# Patient Record
Sex: Male | Born: 2000 | Race: Black or African American | Hispanic: No | Marital: Single | Smoking: Never smoker
Health system: Southern US, Community
[De-identification: ages and names within clinical notes are randomized; demographics above are authoritative.]

## PROBLEM LIST (undated history)

## (undated) DIAGNOSIS — F1010 Alcohol abuse, uncomplicated: Secondary | ICD-10-CM

---
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
## 2020-02-15 ENCOUNTER — Encounter: Payer: Self-pay | Admitting: Internal Medicine

## 2020-02-15 ENCOUNTER — Other Ambulatory Visit: Payer: Self-pay

## 2020-02-15 ENCOUNTER — Inpatient Hospital Stay
Admission: EM | Admit: 2020-02-15 | Discharge: 2020-02-15 | DRG: 894 | Discharge status: Against Medical Advice | Payer: Self-pay | Attending: Internal Medicine | Admitting: Internal Medicine

## 2020-02-15 ENCOUNTER — Emergency Department: Payer: Self-pay

## 2020-02-15 DIAGNOSIS — U071 COVID-19: Secondary | ICD-10-CM | POA: Diagnosis present

## 2020-02-15 DIAGNOSIS — Y906 Blood alcohol level of 120-199 mg/100 ml: Secondary | ICD-10-CM | POA: Diagnosis present

## 2020-02-15 DIAGNOSIS — F1290 Cannabis use, unspecified, uncomplicated: Secondary | ICD-10-CM | POA: Diagnosis present

## 2020-02-15 DIAGNOSIS — G939 Disorder of brain, unspecified: Secondary | ICD-10-CM | POA: Diagnosis present

## 2020-02-15 DIAGNOSIS — G249 Dystonia, unspecified: Secondary | ICD-10-CM | POA: Diagnosis present

## 2020-02-15 DIAGNOSIS — F10920 Alcohol use, unspecified with intoxication, uncomplicated: Secondary | ICD-10-CM

## 2020-02-15 DIAGNOSIS — F10120 Alcohol abuse with intoxication, uncomplicated: Principal | ICD-10-CM | POA: Diagnosis present

## 2020-02-15 DIAGNOSIS — G253 Myoclonus: Secondary | ICD-10-CM | POA: Diagnosis present

## 2020-02-15 DIAGNOSIS — F1010 Alcohol abuse, uncomplicated: Secondary | ICD-10-CM | POA: Diagnosis present

## 2020-02-15 DIAGNOSIS — G9341 Metabolic encephalopathy: Secondary | ICD-10-CM | POA: Diagnosis present

## 2020-02-15 HISTORY — DX: Alcohol abuse, uncomplicated: F10.10

## 2020-02-15 LAB — CBC WITH DIFFERENTIAL/PLATELET
Abs Immature Granulocytes: 0.06 10*3/uL (ref 0.00–0.07)
Basophils Absolute: 0 10*3/uL (ref 0.0–0.1)
Basophils Relative: 0 %
Eosinophils Absolute: 0 10*3/uL (ref 0.0–0.5)
Eosinophils Relative: 0 %
HCT: 44.6 % (ref 39.0–52.0)
Hemoglobin: 15 g/dL (ref 13.0–17.0)
Immature Granulocytes: 1 %
Lymphocytes Relative: 8 %
Lymphs Abs: 0.7 10*3/uL (ref 0.7–4.0)
MCH: 32 pg (ref 26.0–34.0)
MCHC: 33.6 g/dL (ref 30.0–36.0)
MCV: 95.1 fL (ref 80.0–100.0)
Monocytes Absolute: 0.3 10*3/uL (ref 0.1–1.0)
Monocytes Relative: 3 %
Neutro Abs: 7.8 10*3/uL — ABNORMAL HIGH (ref 1.7–7.7)
Neutrophils Relative %: 88 %
Platelets: 293 10*3/uL (ref 150–400)
RBC: 4.69 MIL/uL (ref 4.22–5.81)
RDW: 12.8 % (ref 11.5–15.5)
WBC: 8.9 10*3/uL (ref 4.0–10.5)
nRBC: 0 % (ref 0.0–0.2)

## 2020-02-15 LAB — RESPIRATORY PANEL BY RT PCR (FLU A&B, COVID)
Influenza A by PCR: NEGATIVE
Influenza B by PCR: NEGATIVE
SARS Coronavirus 2 by RT PCR: POSITIVE — AB

## 2020-02-15 LAB — URINALYSIS, COMPLETE (UACMP) WITH MICROSCOPIC
Bacteria, UA: NONE SEEN
Bilirubin Urine: NEGATIVE
Glucose, UA: NEGATIVE mg/dL
Hgb urine dipstick: NEGATIVE
Ketones, ur: 5 mg/dL — AB
Leukocytes,Ua: NEGATIVE
Nitrite: NEGATIVE
Protein, ur: NEGATIVE mg/dL
Specific Gravity, Urine: 1.023 (ref 1.005–1.030)
pH: 8 (ref 5.0–8.0)

## 2020-02-15 LAB — COMPREHENSIVE METABOLIC PANEL
ALT: 17 U/L (ref 0–44)
AST: 25 U/L (ref 15–41)
Albumin: 4.7 g/dL (ref 3.5–5.0)
Alkaline Phosphatase: 63 U/L (ref 38–126)
Anion gap: 11 (ref 5–15)
BUN: 19 mg/dL (ref 6–20)
CO2: 26 mmol/L (ref 22–32)
Calcium: 9.4 mg/dL (ref 8.9–10.3)
Chloride: 108 mmol/L (ref 98–111)
Creatinine, Ser: 0.78 mg/dL (ref 0.61–1.24)
GFR calc Af Amer: >60 mL/min (ref >60)
GFR calc non Af Amer: >60 mL/min (ref >60)
Glucose, Bld: 122 mg/dL — ABNORMAL HIGH (ref 70–99)
Potassium: 3.8 mmol/L (ref 3.5–5.1)
Sodium: 145 mmol/L (ref 135–145)
Total Bilirubin: 0.4 mg/dL (ref 0.3–1.2)
Total Protein: 7.8 g/dL (ref 6.5–8.1)

## 2020-02-15 LAB — URINE DRUG SCREEN, QUALITATIVE (ARMC ONLY)
Amphetamines, Ur Screen: NOT DETECTED
Barbiturates, Ur Screen: NOT DETECTED
Benzodiazepine, Ur Scrn: NOT DETECTED
Cannabinoid 50 Ng, Ur ~~LOC~~: POSITIVE — AB
Cocaine Metabolite,Ur ~~LOC~~: NOT DETECTED
MDMA (Ecstasy)Ur Screen: NOT DETECTED
Methadone Scn, Ur: NOT DETECTED
Opiate, Ur Screen: NOT DETECTED
Phencyclidine (PCP) Ur S: NOT DETECTED
Tricyclic, Ur Screen: NOT DETECTED

## 2020-02-15 LAB — ACETAMINOPHEN LEVEL: Acetaminophen (Tylenol), Serum: <10 ug/mL — ABNORMAL LOW (ref 10–30)

## 2020-02-15 LAB — ETHANOL: Alcohol, Ethyl (B): 159 mg/dL — ABNORMAL HIGH (ref <10)

## 2020-02-15 LAB — CK: Total CK: 229 U/L (ref 49–397)

## 2020-02-15 LAB — LIPASE, BLOOD: Lipase: 22 U/L (ref 11–51)

## 2020-02-15 LAB — SALICYLATE LEVEL: Salicylate Lvl: <7 mg/dL — ABNORMAL LOW (ref 7.0–30.0)

## 2020-02-15 LAB — MAGNESIUM: Magnesium: 1.9 mg/dL (ref 1.7–2.4)

## 2020-02-15 MED ORDER — LORAZEPAM 2 MG/ML IJ SOLN: 0.0000 mg | Freq: Four times a day (QID) | INTRAMUSCULAR | Status: DC

## 2020-02-15 MED ORDER — ENOXAPARIN SODIUM 40 MG/0.4ML ~~LOC~~ SOLN: 40.0000 mg | SUBCUTANEOUS | Status: DC

## 2020-02-15 MED ORDER — IBUPROFEN 600 MG PO TABS: 600.0000 mg | ORAL_TABLET | Freq: Three times a day (TID) | ORAL | 0 refills | Status: DC | PRN

## 2020-02-15 MED ORDER — LORAZEPAM 2 MG/ML IJ SOLN: 1.0000 mg | Freq: Two times a day (BID) | INTRAMUSCULAR | Status: DC | PRN

## 2020-02-15 MED ORDER — SODIUM CHLORIDE 0.9 % IV BOLUS
1000.0000 mL | Freq: Once | INTRAVENOUS | Status: AC
  Administered 2020-02-15: 1000 mL via INTRAVENOUS

## 2020-02-15 MED ORDER — DROPERIDOL 2.5 MG/ML IJ SOLN
2.5000 mg | Freq: Once | INTRAMUSCULAR | Status: AC
  Administered 2020-02-15: 2.5 mg via INTRAVENOUS

## 2020-02-15 MED ORDER — ADULT MULTIVITAMIN W/MINERALS CH: 1.0000 | ORAL_TABLET | Freq: Every day | ORAL | Status: DC

## 2020-02-15 MED ORDER — DIPHENHYDRAMINE HCL 50 MG/ML IJ SOLN
INTRAMUSCULAR | Status: AC
  Administered 2020-02-15: 05:00:00 25 mg via INTRAVENOUS
  Filled 2020-02-15: qty 1

## 2020-02-15 MED ORDER — DM-GUAIFENESIN ER 30-600 MG PO TB12: 1.0000 | ORAL_TABLET | Freq: Two times a day (BID) | ORAL | Status: DC

## 2020-02-15 MED ORDER — OXYCODONE-ACETAMINOPHEN 5-325 MG PO TABS: 1.0000 | ORAL_TABLET | ORAL | 0 refills | Status: DC | PRN

## 2020-02-15 MED ORDER — ASCORBIC ACID 500 MG PO TABS: 500.0000 mg | ORAL_TABLET | Freq: Every day | ORAL | Status: DC

## 2020-02-15 MED ORDER — ONDANSETRON HCL 4 MG/2ML IJ SOLN: 4.0000 mg | Freq: Four times a day (QID) | INTRAMUSCULAR | Status: DC | PRN

## 2020-02-15 MED ORDER — LORAZEPAM 2 MG/ML IJ SOLN
1.0000 mg | Freq: Once | INTRAMUSCULAR | Status: AC
  Administered 2020-02-15: 1 mg via INTRAVENOUS
  Filled 2020-02-15: qty 1

## 2020-02-15 MED ORDER — THIAMINE HCL 100 MG/ML IJ SOLN: 100.0000 mg | Freq: Every day | INTRAMUSCULAR | Status: DC

## 2020-02-15 MED ORDER — LORAZEPAM 2 MG/ML IJ SOLN: 1.0000 mg | INTRAMUSCULAR | Status: DC | PRN

## 2020-02-15 MED ORDER — FOLIC ACID 1 MG PO TABS: 1.0000 mg | ORAL_TABLET | Freq: Every day | ORAL | Status: DC

## 2020-02-15 MED ORDER — ZINC SULFATE 220 (50 ZN) MG PO CAPS: 220.0000 mg | ORAL_CAPSULE | Freq: Every day | ORAL | Status: DC

## 2020-02-15 MED ORDER — THIAMINE HCL 100 MG PO TABS: 100.0000 mg | ORAL_TABLET | Freq: Every day | ORAL | Status: DC

## 2020-02-15 MED ORDER — DIPHENHYDRAMINE HCL 50 MG/ML IJ SOLN: 25.0000 mg | Freq: Once | INTRAMUSCULAR | Status: AC

## 2020-02-15 MED ORDER — LORAZEPAM 1 MG PO TABS: 1.0000 mg | ORAL_TABLET | ORAL | Status: DC | PRN

## 2020-02-15 MED ORDER — ALBUTEROL SULFATE HFA 108 (90 BASE) MCG/ACT IN AERS
2.0000 | INHALATION_SPRAY | RESPIRATORY_TRACT | Status: DC | PRN
  Filled 2020-02-15: qty 6.7

## 2020-02-15 MED ORDER — LORAZEPAM 2 MG/ML IJ SOLN: 0.0000 mg | Freq: Two times a day (BID) | INTRAMUSCULAR | Status: DC

## 2020-02-15 MED ORDER — ONDANSETRON HCL 4 MG PO TABS: 4.0000 mg | ORAL_TABLET | Freq: Four times a day (QID) | ORAL | Status: DC | PRN

## 2020-02-15 NOTE — Consult Note (Signed)
Referring Physician:  No referring provider defined for this encounter.  Primary Physician:  Patient, No Pcp Per  Chief Complaint:  Abnormal CT scan   History of Present Illness: 02/15/2020 NYAIR DEPAULO is a 19 y.o. male who presents with the chief complaint of cramping, agitation, and muscle jerking.  He is normally completely independent and intact.  He reports that he became agitated and had muscle twitching that impacted all 4 limbs.  It is unpredictable and has not ever happened before.  He feels his thinking and strength are normal.  Otherwise, he has no complaints.  He has significant substance use history, and did take medications off the street yesterday.   Review of Systems:  A 10 point review of systems is negative, except for the pertinent positives and negatives detailed in the HPI.  Past Medical History: Past Medical History:  Diagnosis Date  . Alcohol abuse     Past Surgical History: No past surgical history on file.  Allergies: Allergies as of 02/15/2020  . (No Known Allergies)    Medications:  Current Facility-Administered Medications:  .  albuterol (VENTOLIN HFA) 108 (90 Base) MCG/ACT inhaler 2 puff, 2 puff, Inhalation, Q4H PRN, Lorretta Harp, MD .  ascorbic acid (VITAMIN C) tablet 500 mg, 500 mg, Oral, Daily, Lorretta Harp, MD .  dextromethorphan-guaiFENesin (MUCINEX DM) 30-600 MG per 12 hr tablet 1 tablet, 1 tablet, Oral, BID, Lorretta Harp, MD .  enoxaparin (LOVENOX) injection 40 mg, 40 mg, Subcutaneous, Q24H, Lorretta Harp, MD .  folic acid (FOLVITE) tablet 1 mg, 1 mg, Oral, Daily, Lorretta Harp, MD .  LORazepam (ATIVAN) injection 0-4 mg, 0-4 mg, Intravenous, Q6H **FOLLOWED BY** [START ON 02/17/2020] LORazepam (ATIVAN) injection 0-4 mg, 0-4 mg, Intravenous, Q12H, Lorretta Harp, MD .  LORazepam (ATIVAN) injection 1 mg, 1 mg, Intravenous, Q12H PRN, Lorretta Harp, MD .  LORazepam (ATIVAN) tablet 1-4 mg, 1-4 mg, Oral, Q1H PRN **OR** LORazepam (ATIVAN) injection 1-4 mg,  1-4 mg, Intravenous, Q1H PRN, Lorretta Harp, MD .  multivitamin with minerals tablet 1 tablet, 1 tablet, Oral, Daily, Lorretta Harp, MD .  ondansetron (ZOFRAN) tablet 4 mg, 4 mg, Oral, Q6H PRN **OR** ondansetron (ZOFRAN) injection 4 mg, 4 mg, Intravenous, Q6H PRN, Lorretta Harp, MD .  thiamine tablet 100 mg, 100 mg, Oral, Daily **OR** thiamine (B-1) injection 100 mg, 100 mg, Intravenous, Daily, Lorretta Harp, MD .  zinc sulfate capsule 220 mg, 220 mg, Oral, Daily, Lorretta Harp, MD No current outpatient medications on file.   Social History: Social History   Tobacco Use  . Smoking status: Never Smoker  . Smokeless tobacco: Never Used  Substance Use Topics  . Alcohol use: Yes  . Drug use: Yes    Types: Marijuana    Family Medical History: No family history on file.  Physical Examination: Vitals:   02/15/20 0430 02/15/20 0500  BP: (!) 142/95 112/66  Pulse: 79 79  Resp: 19   Temp:    SpO2: 100% 99%     General: Patient is well developed, well nourished, calm, collected, and in no apparent distress.  Psychiatric: Patient is non-anxious.  Head:  Pupils equal, round, and reactive to light.  ENT:  Oral mucosa appears well hydrated.  Neck:   Supple.  Full range of motion.  Respiratory: Patient is breathing without any difficulty.  Extremities: No edema.  Vascular: Palpable pulses in dorsal pedal vessels.  Skin:   On exposed skin, there are no abnormal skin lesions.  NEUROLOGICAL:  General: In  no acute distress.   Sleepy but easily arousable. Oriented to person, place, and time.  Pupils equal round and reactive to light.  Facial tone is symmetric.  Tongue protrusion is midline.  There is no pronator drift.   Strength: Side Biceps Triceps Deltoid Interossei Grip Wrist Ext. Wrist Flex.  R 5 5 5 5 5 5 5   L 5 5 5 5 5 5 5    Side Iliopsoas Quads Hamstring PF DF EHL  R 5 5 5 5 5 5   L 5 5 5 5 5 5    Reflexes are 2+ and symmetric at the biceps, triceps, brachioradialis, patella and  achilles.   Bilateral upper and lower extremity sensation is intact to light touch and pin prick.  Gait is untested.   Hoffman's is absent.  Imaging: CT Head 02/15/2020 IMPRESSION: Tiny 4 mm hyperdense focus in the posterior left parietal lobe which could represent a small cavernoma, or tiny intraparenchymal contusion. If further evaluation is required would recommend MRI when patient is stable.   Electronically Signed   By: Prudencio Pair M.D.   On: 02/15/2020 06:24   I have personally reviewed the images and agree with the above interpretation.  Labs: CBC Latest Ref Rng & Units 02/15/2020  WBC 4.0 - 10.5 K/uL 8.9  Hemoglobin 13.0 - 17.0 g/dL 15.0  Hematocrit 39.0 - 52.0 % 44.6  Platelets 150 - 400 K/uL 293     Assessment and Plan: Mr. Collister is a pleasant 19 y.o. male with abnormal CT scan with hyperdense lesion most consistent with cavernoma, but possibly could be old hemorrhage or other lesion such as primary lesion of the brain or metastasis (very unlikely).  Would get neurology consultation for muscle jerking. I defer to neurology regarding AED treatment.  Agree with MRI of brain.  Please let me know when this is performed.  Likely will need outpatient follow up.      Broc Caspers K. Izora Ribas MD, [place] [facility] of Neurosurgery

## 2020-02-15 NOTE — ED Notes (Signed)
Pt given remote for TV- MRI screened pt via phone

## 2020-02-15 NOTE — ED Notes (Signed)
Pt calling out again and unable to sit still. Muscles visibly twitching and pt continues to appear unable to control his movements. Pt yelling out. MD made aware.

## 2020-02-15 NOTE — H&P (Signed)
History and Physical    [name] [name] DOB: 03/24/2001 DOA: 02/15/2020  Referring MD/NP/PA:   PCP: Patient, No Pcp Per   Patient coming from:  The patient is coming from home.  At baseline, pt is independent for most of ADL.        Chief Complaint: cramping sensation, agitation, jerking  HPI: [name] is a 19 y.o. male with medical history significant of alcohol abuse, who presents with cramping sensation, agitation, jerking.   Pt states that he has cramping sensation all over his body. Pt has agitation and yelling. He appears restless and cannot stay still.  pt was noted to have muscles twitching in ED. He repeatedly states that he feels uncomfortable. Per EDP's note, he admits that he takes "Perc 30s" off the street.  States he has not had any in a few days. He has nausea, no vomiting, diarrhea or abdominal pain.  Patient denies any cough, chest pain, shortness breath.  No fever or chills.  No symptoms of UTI.  No slurred speech or vision loss.  ED Course: pt was found to have WBC 8.9, lipase 22, urinalysis positive for cannabinoids, Tylenol level less than 10, salicylate level less than 7, alcohol level 159, postive Covid PCR, CK 229, liver function normal, electrolytes renal function okay, temperature normal, blood pressure 112/95, heart rate 79, RR 20, oxygen saturation 100% on room air, chest x-ray negative. Pt is admitted to med-surg bed as inpt. Neurosurgeon, Dr. Myer Haff is consulted.  # CT-head showed: Tiny 4 mm hyperdense focus in the posterior left parietal lobe which could represent a small cavernoma, or tiny intraparenchymal contusion. If further evaluation is required would recommend MRI when patient is stable.  Review of Systems:   General: no fevers, chills, no body weight gain, no fatigue HEENT: no blurry vision, hearing changes or sore throat Respiratory: no dyspnea, coughing, wheezing CV: no chest pain, no palpitations GI: has nausea, no  vomiting, abdominal pain, diarrhea, constipation GU: no dysuria, burning on urination, increased urinary frequency, hematuria  Ext: no leg edema Neuro: no unilateral weakness, numbness, or tingling, no vision change or hearing loss. Has agitation, muscle twitching, restless. Skin: no rash, no skin tear. MSK: No deformity, no limitation of range of movement in spin Heme: No easy bruising.  Travel history: No recent long distant travel.  Allergy: No Known Allergies  Past Medical History:  Diagnosis Date  . Alcohol abuse     No past surgical history on file.  Social History:  reports that he has never smoked. He has never used smokeless tobacco. He reports current alcohol use. He reports current drug use. Drug: Marijuana.  Family History: Reviewed with patient, patient states that all family members are healthy  Prior to Admission medications   Not on File    Physical Exam: Vitals:   02/15/20 0333 02/15/20 0420 02/15/20 0430 02/15/20 0500  BP: 125/68 122/81 (!) 142/95 112/66  Pulse: 89  79 79  Resp: 20 19 19    Temp: 98.4 F (36.9 C)     TempSrc: Oral     SpO2: 100%  100% 99%  Weight: 77.1 kg     Height: 6\' 4"  (1.93 m)      General: Not in acute distress HEENT:       Eyes: PERRL, EOMI, no scleral icterus.       ENT: No discharge from the ears and nose, no pharynx injection, no tonsillar enlargement.        Neck:  No JVD, no bruit, no mass felt. Heme: No neck lymph node enlargement. Cardiac: S1/S2, RRR, No murmurs, No gallops or rubs. Respiratory:  No rales, wheezing, rhonchi or rubs. GI: Soft, nondistended, nontender, no rebound pain, no organomegaly, BS present. GU: No hematuria Ext: No pitting leg edema bilaterally. 2+DP/PT pulse bilaterally. Musculoskeletal: No joint deformities, No joint redness or warmth, no limitation of ROM in spin. Skin: No rashes.  Neuro: Alert, anxious looking, oriented X3, cranial nerves II-XII grossly intact, moves all extremities normally.    Psych: no suicidal or hemocidal ideation.  Labs on Admission: I have personally reviewed following labs and imaging studies  CBC: Recent Labs  Lab 02/15/20 0353  WBC 8.9  NEUTROABS 7.8*  HGB 15.0  HCT 44.6  MCV 95.1  PLT 293   Basic Metabolic Panel: Recent Labs  Lab 02/15/20 0353  NA 145  K 3.8  CL 108  CO2 26  GLUCOSE 122*  BUN 19  CREATININE 0.78  CALCIUM 9.4  MG 1.9   GFR: Estimated Creatinine Clearance: 160.6 mL/min (by C-G formula based on SCr of 0.78 mg/dL). Liver Function Tests: Recent Labs  Lab 02/15/20 0353  AST 25  ALT 17  ALKPHOS 63  BILITOT 0.4  PROT 7.8  ALBUMIN 4.7   Recent Labs  Lab 02/15/20 0353  LIPASE 22   No results for input(s): AMMONIA in the last 168 hours. Coagulation Profile: No results for input(s): INR, PROTIME in the last 168 hours. Cardiac Enzymes: Recent Labs  Lab 02/15/20 0353  CKTOTAL 229   BNP (last 3 results) No results for input(s): PROBNP in the last 8760 hours. HbA1C: No results for input(s): HGBA1C in the last 72 hours. CBG: No results for input(s): GLUCAP in the last 168 hours. Lipid Profile: No results for input(s): CHOL, HDL, LDLCALC, TRIG, CHOLHDL, LDLDIRECT in the last 72 hours. Thyroid Function Tests: No results for input(s): TSH, T4TOTAL, FREET4, T3FREE, THYROIDAB in the last 72 hours. Anemia Panel: No results for input(s): VITAMINB12, FOLATE, FERRITIN, TIBC, IRON, RETICCTPCT in the last 72 hours. Urine analysis:    Component Value Date/Time   COLORURINE YELLOW (A) 02/15/2020 0353   APPEARANCEUR CLEAR (A) 02/15/2020 0353   LABSPEC 1.023 02/15/2020 0353   PHURINE 8.0 02/15/2020 0353   GLUCOSEU NEGATIVE 02/15/2020 0353   HGBUR NEGATIVE 02/15/2020 0353   BILIRUBINUR NEGATIVE 02/15/2020 0353   KETONESUR 5 (A) 02/15/2020 0353   PROTEINUR NEGATIVE 02/15/2020 0353   UROBILINOGEN 0.2 05/01/2007 2207   NITRITE NEGATIVE 02/15/2020 0353   LEUKOCYTESUR NEGATIVE 02/15/2020 0353   Sepsis  Labs: @LABRCNTIP (procalcitonin:4,lacticidven:4) ) Recent Results (from the past 240 hour(s))  Respiratory Panel by RT PCR (Flu A&B, Covid) - Nasopharyngeal Swab     Status: Abnormal   Collection Time: 02/15/20  6:21 AM   Specimen: Nasopharyngeal Swab  Result Value Ref Range Status   SARS Coronavirus 2 by RT PCR POSITIVE (A) NEGATIVE Final    Comment: RESULT CALLED TO, READ BACK BY AND VERIFIED WITH: ARIEL SMITH AT 0748 ON 02/15/2020 [facility]. (NOTE) SARS-CoV-2 target nucleic acids are DETECTED. SARS-CoV-2 RNA is generally detectable in upper respiratory specimens  during the acute phase of infection. Positive results are indicative of the presence of the identified virus, but do not rule out bacterial infection or co-infection with other pathogens not detected by the test. Clinical correlation with patient history and other diagnostic information is necessary to determine patient infection status. The expected result is Negative. Fact Sheet for Patients:  02/17/2020 Fact Sheet for  Healthcare Providers: https://www.young.biz/ This test is not yet approved or cleared by the [place] and  has been authorized for detection and/or diagnosis of SARS-CoV-2 by FDA under an Emergency Use Authorization (EUA).  This EUA will remain in effect (meaning this test can be used)  for the duration of  the COVID-19 declaration under Section 564(b)(1) of the Act, 21 U.S.C. section 360bbb-3(b)(1), unless the authorization is terminated or revoked sooner.    Influenza A by PCR NEGATIVE NEGATIVE Final   Influenza B by PCR NEGATIVE NEGATIVE Final    Comment: (NOTE) The Xpert Xpress SARS-CoV-2/FLU/RSV assay is intended as an aid in  the diagnosis of influenza from Nasopharyngeal swab specimens and  should not be used as a sole basis for treatment. Nasal washings and  aspirates are unacceptable for Xpert Xpress SARS-CoV-2/FLU/RSV  testing. Fact Sheet  for Patients: https://www.moore.com/ Fact Sheet for Healthcare Providers: https://www.young.biz/ This test is not yet approved or cleared by the [place] FDA and  has been authorized for detection and/or diagnosis of SARS-CoV-2 by  FDA under an Emergency Use Authorization (EUA). This EUA will remain  in effect (meaning this test can be used) for the duration of the  Covid-19 declaration under Section 564(b)(1) of the Act, 21  U.S.C. section 360bbb-3(b)(1), unless the authorization is  terminated or revoked. Performed at [facility], [address] [place], [place] [address]      Radiological Exams on Admission: CT Head Wo Contrast  Result Date: 02/15/2020 CLINICAL DATA:  Change in mental status EXAM: CT HEAD WITHOUT CONTRAST TECHNIQUE: Contiguous axial images were obtained from the base of the skull through the vertex without intravenous contrast. COMPARISON:  None. FINDINGS: Somewhat limited due to patient motion. There appears to be a tiny 4 mm hyperdense focus seen within the posterior left parietal lobe, series 4, image 21. No mass effect is seen. Normal gray-white differentiation. Ventricles are normal in size and contour. Vascular: No hyperdense vessel or unexpected calcification. Skull: The skull is intact. No fracture or focal lesion identified. Sinuses/Orbits: The visualized paranasal sinuses and mastoid air cells are clear. The orbits and globes intact. Other: None IMPRESSION: Tiny 4 mm hyperdense focus in the posterior left parietal lobe which could represent a small cavernoma, or tiny intraparenchymal contusion. If further evaluation is required would recommend MRI when patient is stable. Electronically Signed   By: Jonna Clark M.D.   On: 02/15/2020 06:24   DG Chest Port 1 View  Result Date: 02/15/2020 CLINICAL DATA:  Spasms and bizarre behavior EXAM: PORTABLE CHEST 1 VIEW COMPARISON:  01/30/2008 FINDINGS: The heart size and  mediastinal contours are within normal limits. Both lungs are clear. The visualized skeletal structures are unremarkable. IMPRESSION: No active disease. Electronically Signed   By: Marnee Spring M.D.   On: 02/15/2020 05:02     EKG: Independently reviewed.  Sinus rhythm, QTC 426, nonspecific T wave change  Assessment/Plan Principal Problem:   Acute metabolic encephalopathy Active Problems:   Alcohol abuse   COVID-19 virus infection   Brain lesion   Acute metabolic encephalopathy: Patient has agitation, restlessness, muscle twitching and spasm.  Etiology is not clear.  UDS is positive for cannabinoid.  Alcohol level 159.  Patient states that took Percocet from street. Suspecting withdrawal now.  CT of head showed a small brain lesion, cavernoma versus contusion.  Dr. Myer Haff of neurosurgery and Dr. Loretha Brasil of neurology are consulted.  -Admitted to MedSurg bed as inpatient -Start CIWA protocol -Frequent neuro check -  IV fluid: 2 L normal saline were given in ED  Alcohol abuse: -CiWA protocol -Did counseling about the importance of quitting drinking  Brain lesion: CT-head showed a tiny 4 mm hyperdense focus in the posterior left parietal lobe which could represent a small cavernoma, or tiny intraparenchymal contusion. -f/u neurosurgeon's recommendations -will get MRI of brain with and without constrast  COVID-19 virus infection: pt has positive COVID-19 PCR, but patient is asymptomatic.  No cough, fever, chills, shortness breath, chest pain.  Chest x-ray negative.  Oxygen saturation 100% on room air. -Start vitamin C and zinc sulfate -As needed albuterol inhaler and Mucinex   Inpatient status:  # Patient requires inpatient status due to high intensity of service, high risk for further deterioration and high frequency of surveillance required.  I certify that at the point of admission it is my clinical judgment that the patient will require inpatient hospital care spanning beyond  2 midnights from the point of admission.  . This patient has hx of alcohol abuse . Now patient has presenting with acute metabolic encephalopathy, has agitation, muscle twitching, positive covid 19 PCR . The initial radiographic and laboratory data are worrisome because of positive covid 19 PCR. CT-head showed possible cavernoma and versus contusion. . Current medical needs: please see my assessment and plan Predictability of an adverse outcome (risk): Patient has multiple comorbidities as listed above. Now presents with acute metabolic encephalopathy, has agitation, muscle twitching, positive covid 19 PCR. Patient's presentation is highly complicated.  Patient is at high risk of deteriorating.  Will need to be treated in hospital for at least 2 days.    DVT ppx: SCD Code Status: Full code Family Communication: not done, no family member is at bed side.   Disposition Plan:  Anticipate discharge back to previous home environment Consults called:  Dr. Izora Ribas of neurosurgeon Admission status: Med-surg bed as inpt      Date of Service 02/15/2020    Short Hospitalists   If 7PM-7AM, please contact night-coverage [email or web address] 02/15/2020, 8:08 AM

## 2020-02-15 NOTE — ED Triage Notes (Signed)
Pt in with co pain all over, pt restless in triage. Cannot pin point a complaint. Pt states "I cannot explain how I feel". Pt co feeling nauseous at this time.

## 2020-02-15 NOTE — ED Notes (Signed)
X-ray at bedside

## 2020-02-15 NOTE — ED Notes (Signed)
Pt given cup of water 

## 2020-02-15 NOTE — ED Notes (Signed)
Pt standing in hallway in civilian clothing asking if he could walk around in the halls stating "my legs hurt"- pt informed that he would have to walk around in room that he could not be walking around in hallway- pt states "can you show me the way out of here?" and this RN asked pt if he wanted to leave to which pt replied yes- informed pt that I would need to let the doctor know that he wanted to leave so the doctor could speak with him- pt adamant about being shown the way out- asked pt to wait so I could message the doctor and pt began to walk down the hall- pt stopped in the hall and asked if he had taken his own IV out- pt showed this RN his arm with IV still in place- lead pt back to room to take IV out and pt asked if we could look up phone numbers for him by going in other peoples charts- informed pt that was against the law- asked pt once again is he sure that he wanted to leave or would he be willing to stay for the remainder of his tests d/t his covid positive status- pt stated he was sure and asked for a mask to leave- pt given mask and pt left- Dr Clyde Lundborg informed of events

## 2020-02-15 NOTE — ED Notes (Signed)
RN to bedside and pt standing next to bed reporting he has had a BM in his pants. Pt standing with his pants off wiping himself with toilet paper. Pt given wipes and new pants.

## 2020-02-15 NOTE — ED Notes (Signed)
Dr Clyde Lundborg at bedside and aware of pt covid positive status

## 2020-02-15 NOTE — ED Notes (Signed)
Pt back to treatment room and laid in bed before starting to scream and yelling, "I'm going to die." pt repeatedly stating he is uncomfortable and can not get comfortable. Pt denies pain specifically but states he feels like something is wrong. This RN witnessed pt stick his fingers down his throat and then state he feels better throwing up and "getting it off my stomach"   Pt has goose bumps noted all over visible skin.

## 2020-02-15 NOTE — Discharge Summary (Signed)
Physician Discharge Summary  [name] NKN:[id number] DOB: Oct 07, 2001 DOA: 02/15/2020  PCP: Patient, No Pcp Per  Admit date: 02/15/2020 Discharge date: 02/15/2020  Recommendations for Outpatient Follow-up:  -none, since pt left on AMA  Home Health: none Equipment/Devices:none   Discharge Condition: mental status improved. CODE STATUS: full Diet recommendation: regular  Brief/Interim Summary (HPI)  [name] is a 19 y.o. male with medical history significant of alcohol abuse, who presents with cramping sensation, agitation, jerking.  Pt states that he has cramping sensation all over his body. Pt has agitation and yelling. He appears restless and cannot stay still. pt was noted to have muscles twitching in ED. He repeatedly states that he feels uncomfortable. Per EDP's note, he admits that he takes "Perc 30s" off the street. States he has not had any in a few days. He has nausea, no vomiting, diarrhea or abdominal pain.  Patient denies any cough, chest pain, shortness breath.  No fever or chills.  No symptoms of UTI.  No slurred speech or vision loss.  ED Course: pt was found to have WBC 8.9, lipase 22, urinalysis positive for cannabinoids, Tylenol level less than 10, salicylate level less than 7, alcohol level 159, postive Covid PCR, CK 229, liver function normal, electrolytes renal function okay, temperature normal, blood pressure 112/95, heart rate 79, RR 20, oxygen saturation 100% on room air, chest x-ray negative. Pt is admitted to med-surg bed as inpt. Neurosurgeon, Dr. Myer Haff is consulted.  # CT-head showed: Tiny 4 mm hyperdense focus in the posterior left parietal lobe which could represent a small cavernoma, or tiny intraparenchymal contusion. If further evaluation is required would recommend MRI when patient is stable.  Subjective:  -pt has cramping sensation, agitation, jerking. Has nausea, no vomiting, diarrhea or abdominal pain.  No cough, shortness breath  or chest pain.  No fever or chills.    Discharge Diagnoses and Hospital Course:   Principal Problem:   Acute metabolic encephalopathy Active Problems:   Alcohol abuse   COVID-19 virus infection   Brain lesion   Acute metabolic encephalopathy: Patient has agitation, restlessness, muscle twitching and spasm.  Etiology is not clear.  UDS is positive for cannabinoid.  Alcohol level 159.  Patient states that took Percocet from street. Suspecting withdrawal now.  CT of head showed a small brain lesion, cavernoma versus contusion.  Dr. Myer Haff of neurosurgery and Dr. Loretha Brasil of neurology are consulted.  -Admitted to MedSurg bed as inpatient -Start CIWA protocol -Frequent neuro check -IV fluid: 2 L normal saline were given in ED -Unfortunately patient left hospital AMA.   Alcohol abuse: -CiWA protocol -Did counseling about the importance of quitting drinking  Brain lesion: CT-head showed a tiny 4 mm hyperdense focus in the posterior left parietal lobe which could represent a small cavernoma, or tiny intraparenchymal contusion. -f/u neurosurgeon's recommendations -plan to do MRI of brain with and without constrast, unfortunately patient left hospital AMA.   COVID-19 virus infection: pt has positive COVID-19 PCR, but patient is asymptomatic.  No cough, fever, chills, shortness breath, chest pain.  Chest x-ray negative.  Oxygen saturation 100% on room air. -Start vitamin C and zinc sulfate -As needed albuterol inhaler and Mucinex      Discharge Instructions: none since pt left on AMA   Allergies as of 02/15/2020   No Known Allergies   Med Rec must be completed prior to using this SMARTLINK    No Known Allergies  Consultations: -Neurosurgeon and neurologist   Procedures/Studies: CT  Head Wo Contrast  Result Date: 02/15/2020 CLINICAL DATA:  Change in mental status EXAM: CT HEAD WITHOUT CONTRAST TECHNIQUE: Contiguous axial images were obtained from the base of the  skull through the vertex without intravenous contrast. COMPARISON:  None. FINDINGS: Somewhat limited due to patient motion. There appears to be a tiny 4 mm hyperdense focus seen within the posterior left parietal lobe, series 4, image 21. No mass effect is seen. Normal gray-white differentiation. Ventricles are normal in size and contour. Vascular: No hyperdense vessel or unexpected calcification. Skull: The skull is intact. No fracture or focal lesion identified. Sinuses/Orbits: The visualized paranasal sinuses and mastoid air cells are clear. The orbits and globes intact. Other: None IMPRESSION: Tiny 4 mm hyperdense focus in the posterior left parietal lobe which could represent a small cavernoma, or tiny intraparenchymal contusion. If further evaluation is required would recommend MRI when patient is stable. Electronically Signed   By: Jonna Clark M.D.   On: 02/15/2020 06:24   DG Chest Port 1 View  Result Date: 02/15/2020 CLINICAL DATA:  Spasms and bizarre behavior EXAM: PORTABLE CHEST 1 VIEW COMPARISON:  01/30/2008 FINDINGS: The heart size and mediastinal contours are within normal limits. Both lungs are clear. The visualized skeletal structures are unremarkable. IMPRESSION: No active disease. Electronically Signed   By: Marnee Spring M.D.   On: 02/15/2020 05:02      Discharge Exam: Vitals:   02/15/20 0430 02/15/20 0500  BP: (!) 142/95 112/66  Pulse: 79 79  Resp: 19   Temp:    SpO2: 100% 99%   Vitals:   02/15/20 0333 02/15/20 0420 02/15/20 0430 02/15/20 0500  BP: 125/68 122/81 (!) 142/95 112/66  Pulse: 89  79 79  Resp: 20 19 19    Temp: 98.4 F (36.9 C)     TempSrc: Oral     SpO2: 100%  100% 99%  Weight: 77.1 kg     Height: 6\' 4"  (1.93 m)       PE was not be able to be performed since pt left on AMA before I was able to see pt.    The results of significant diagnostics from this hospitalization (including imaging, microbiology, ancillary and laboratory) are listed below for  reference.     Microbiology: Recent Results (from the past 240 hour(s))  Respiratory Panel by RT PCR (Flu A&B, Covid) - Nasopharyngeal Swab     Status: Abnormal   Collection Time: 02/15/20  6:21 AM   Specimen: Nasopharyngeal Swab  Result Value Ref Range Status   SARS Coronavirus 2 by RT PCR POSITIVE (A) NEGATIVE Final    Comment: RESULT CALLED TO, READ BACK BY AND VERIFIED WITH: ARIEL SMITH AT 0748 ON 02/15/2020 [facility]. (NOTE) SARS-CoV-2 target nucleic acids are DETECTED. SARS-CoV-2 RNA is generally detectable in upper respiratory specimens  during the acute phase of infection. Positive results are indicative of the presence of the identified virus, but do not rule out bacterial infection or co-infection with other pathogens not detected by the test. Clinical correlation with patient history and other diagnostic information is necessary to determine patient infection status. The expected result is Negative. Fact Sheet for Patients:  02/17/20 Fact Sheet for Healthcare Providers: 02/17/2020 This test is not yet approved or cleared by the https://www.moore.com/ FDA and  has been authorized for detection and/or diagnosis of SARS-CoV-2 by FDA under an Emergency Use Authorization (EUA).  This EUA will remain in effect (meaning this test can be used)  for the duration of  the  COVID-19 declaration under Section 564(b)(1) of the Act, 21 U.S.C. section 360bbb-3(b)(1), unless the authorization is terminated or revoked sooner.    Influenza A by PCR NEGATIVE NEGATIVE Final   Influenza B by PCR NEGATIVE NEGATIVE Final    Comment: (NOTE) The Xpert Xpress SARS-CoV-2/FLU/RSV assay is intended as an aid in  the diagnosis of influenza from Nasopharyngeal swab specimens and  should not be used as a sole basis for treatment. Nasal washings and  aspirates are unacceptable for Xpert Xpress SARS-CoV-2/FLU/RSV  testing. Fact Sheet for  Patients: https://www.moore.com/ Fact Sheet for Healthcare Providers: https://www.young.biz/ This test is not yet approved or cleared by the [place] FDA and  has been authorized for detection and/or diagnosis of SARS-CoV-2 by  FDA under an Emergency Use Authorization (EUA). This EUA will remain  in effect (meaning this test can be used) for the duration of the  Covid-19 declaration under Section 564(b)(1) of the Act, 21  U.S.C. section 360bbb-3(b)(1), unless the authorization is  terminated or revoked. Performed at [facility], [address] [place], [place] [address]      Labs: BNP (last 3 results) No results for input(s): BNP in the last 8760 hours. Basic Metabolic Panel: Recent Labs  Lab 02/15/20 0353  NA 145  K 3.8  CL 108  CO2 26  GLUCOSE 122*  BUN 19  CREATININE 0.78  CALCIUM 9.4  MG 1.9   Liver Function Tests: Recent Labs  Lab 02/15/20 0353  AST 25  ALT 17  ALKPHOS 63  BILITOT 0.4  PROT 7.8  ALBUMIN 4.7   Recent Labs  Lab 02/15/20 0353  LIPASE 22   No results for input(s): AMMONIA in the last 168 hours. CBC: Recent Labs  Lab 02/15/20 0353  WBC 8.9  NEUTROABS 7.8*  HGB 15.0  HCT 44.6  MCV 95.1  PLT 293   Cardiac Enzymes: Recent Labs  Lab 02/15/20 0353  CKTOTAL 229   BNP: Invalid input(s): POCBNP CBG: No results for input(s): GLUCAP in the last 168 hours. D-Dimer No results for input(s): DDIMER in the last 72 hours. Hgb A1c No results for input(s): HGBA1C in the last 72 hours. Lipid Profile No results for input(s): CHOL, HDL, LDLCALC, TRIG, CHOLHDL, LDLDIRECT in the last 72 hours. Thyroid function studies No results for input(s): TSH, T4TOTAL, T3FREE, THYROIDAB in the last 72 hours.  Invalid input(s): FREET3 Anemia work up No results for input(s): VITAMINB12, FOLATE, FERRITIN, TIBC, IRON, RETICCTPCT in the last 72 hours. Urinalysis    Component Value Date/Time    COLORURINE YELLOW (A) 02/15/2020 0353   APPEARANCEUR CLEAR (A) 02/15/2020 0353   LABSPEC 1.023 02/15/2020 0353   PHURINE 8.0 02/15/2020 0353   GLUCOSEU NEGATIVE 02/15/2020 0353   HGBUR NEGATIVE 02/15/2020 0353   BILIRUBINUR NEGATIVE 02/15/2020 0353   KETONESUR 5 (A) 02/15/2020 0353   PROTEINUR NEGATIVE 02/15/2020 0353   UROBILINOGEN 0.2 05/01/2007 2207   NITRITE NEGATIVE 02/15/2020 0353   LEUKOCYTESUR NEGATIVE 02/15/2020 0353   Sepsis Labs Invalid input(s): PROCALCITONIN,  WBC,  LACTICIDVEN Microbiology Recent Results (from the past 240 hour(s))  Respiratory Panel by RT PCR (Flu A&B, Covid) - Nasopharyngeal Swab     Status: Abnormal   Collection Time: 02/15/20  6:21 AM   Specimen: Nasopharyngeal Swab  Result Value Ref Range Status   SARS Coronavirus 2 by RT PCR POSITIVE (A) NEGATIVE Final    Comment: RESULT CALLED TO, READ BACK BY AND VERIFIED WITH: ARIEL SMITH AT 0748 ON 02/15/2020 [facility]. (NOTE) SARS-CoV-2  target nucleic acids are DETECTED. SARS-CoV-2 RNA is generally detectable in upper respiratory specimens  during the acute phase of infection. Positive results are indicative of the presence of the identified virus, but do not rule out bacterial infection or co-infection with other pathogens not detected by the test. Clinical correlation with patient history and other diagnostic information is necessary to determine patient infection status. The expected result is Negative. Fact Sheet for Patients:  PinkCheek.be Fact Sheet for Healthcare Providers: GravelBags.it This test is not yet approved or cleared by the [place] FDA and  has been authorized for detection and/or diagnosis of SARS-CoV-2 by FDA under an Emergency Use Authorization (EUA).  This EUA will remain in effect (meaning this test can be used)  for the duration of  the COVID-19 declaration under Section 564(b)(1) of the Act, 21 U.S.C. section  360bbb-3(b)(1), unless the authorization is terminated or revoked sooner.    Influenza A by PCR NEGATIVE NEGATIVE Final   Influenza B by PCR NEGATIVE NEGATIVE Final    Comment: (NOTE) The Xpert Xpress SARS-CoV-2/FLU/RSV assay is intended as an aid in  the diagnosis of influenza from Nasopharyngeal swab specimens and  should not be used as a sole basis for treatment. Nasal washings and  aspirates are unacceptable for Xpert Xpress SARS-CoV-2/FLU/RSV  testing. Fact Sheet for Patients: PinkCheek.be Fact Sheet for Healthcare Providers: GravelBags.it This test is not yet approved or cleared by the [place] FDA and  has been authorized for detection and/or diagnosis of SARS-CoV-2 by  FDA under an Emergency Use Authorization (EUA). This EUA will remain  in effect (meaning this test can be used) for the duration of the  Covid-19 declaration under Section 564(b)(1) of the Act, 21  U.S.C. section 360bbb-3(b)(1), unless the authorization is  terminated or revoked. Performed at [facility], [address], [place], [place] [address]     Time coordinating discharge:  25 minutes.   SIGNED:  Ivor Costa, MD [facility] 02/15/2020, 11:19 AM   If 7PM-7AM, please contact night-coverage [email or web address]

## 2020-02-15 NOTE — ED Notes (Signed)
Pt able to lay in bed but continues to yell out at times. Pt is apologetic for yelling. Pt is very cooperative at this time.

## 2020-02-15 NOTE — ED Provider Notes (Signed)
[facility] Emergency Department Provider Note   ____________________________________________   First MD Initiated Contact with Patient 02/15/20 0354     (approximate)  I have reviewed the triage vital signs and the nursing notes.   HISTORY  Chief Complaint Abdominal Pain  Level V caveat: Limited by bizarre behavior  HPI [name] is a 19 y.o. male who presents to the ED with a chief complaint of cramping sensation all over his body.  Endorses alcohol use tonight.  Denies drug use.  Appears restless and cannot stay still.  Occasional jerking of his limbs when he experiences cramps.  Denies pain.  Symptoms associated with nausea.  Denies spider bite.  Denies fever, cough, chest pain, shortness of breath, abdominal pain, vomiting or diarrhea.  Denies recent trauma.       Past medical history None  Patient Active Problem List   Diagnosis Date Noted  . 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  Prior to Admission medications   Not on File    Allergies Patient has no known allergies.  No family history on file.  Social History Social History   Tobacco Use  . Smoking status: Never Smoker  Substance Use Topics  . Alcohol use: No  . Drug use: Not on file    Review of Systems  Constitutional: No fever/chills Eyes: No visual changes. ENT: No sore throat. Cardiovascular: Denies chest pain. Respiratory: Denies shortness of breath. Gastrointestinal: No abdominal pain.  Positive for nausea, no vomiting.  No diarrhea.  No constipation. Genitourinary: Negative for dysuria. Musculoskeletal: Positive for whole body cramps.  Negative for back pain. Skin: Negative for rash. Neurological: Negative for headaches, focal weakness or numbness. 10 point review of systems limited secondary to patient's distress.  ____________________________________________   PHYSICAL EXAM:  VITAL SIGNS: ED Triage Vitals [02/15/20 0333]    Enc Vitals Group     BP 125/68     Pulse Rate 89     Resp 20     Temp 98.4 F (36.9 C)     Temp Source Oral     SpO2 100 %     Weight 170 lb (77.1 kg)     Height 6\' 4"  (1.93 m)     Head Circumference      Peak Flow      Pain Score 10     Pain Loc      Pain Edu?      Excl. in GC?     Constitutional: Alert and oriented.  Uncomfortable appearing and in mild to moderate acute distress.  Intermittent cramping of whole body. Eyes: Conjunctivae are normal. PERRL. EOMI. Head: Atraumatic. Nose: Atraumatic. Mouth/Throat: Mucous membranes are moist.  No dental malocclusion. Neck: No stridor.  Supple neck without meningismus. Cardiovascular: Normal rate, regular rhythm. Grossly normal heart sounds.  Good peripheral circulation. Respiratory: Normal respiratory effort.  No retractions. Lungs CTAB. Gastrointestinal: Soft and nontender to light or deep palpation. No distention. No abdominal bruits. No CVA tenderness. Musculoskeletal: No lower extremity tenderness nor edema.  No joint effusions. Neurologic:  Normal speech and language. No gross focal neurologic deficits are appreciated.  Skin:  Skin is warm, dry and intact. No rash noted. Psychiatric: Mood and affect are bizarre. Speech and behavior are normal.  ____________________________________________   LABS (all labs ordered are listed, but only abnormal results are displayed)  Labs Reviewed  CBC WITH DIFFERENTIAL/PLATELET - Abnormal; Notable for the following components:  Result Value   Neutro Abs 7.8 (*)    All other components within normal limits  COMPREHENSIVE METABOLIC PANEL - Abnormal; Notable for the following components:   Glucose, Bld 122 (*)    All other components within normal limits  URINALYSIS, COMPLETE (UACMP) WITH MICROSCOPIC - Abnormal; Notable for the following components:   Color, Urine YELLOW (*)    APPearance CLEAR (*)    Ketones, ur 5 (*)    All other components within normal limits  URINE DRUG  SCREEN, QUALITATIVE ([facility] ONLY) - Abnormal; Notable for the following components:   Cannabinoid 50 Ng, Ur [place] POSITIVE (*)    All other components within normal limits  ETHANOL - Abnormal; Notable for the following components:   Alcohol, Ethyl (B) 159 (*)    All other components within normal limits  ACETAMINOPHEN LEVEL - Abnormal; Notable for the following components:   Acetaminophen (Tylenol), Serum <10 (*)    All other components within normal limits  SALICYLATE LEVEL - Abnormal; Notable for the following components:   Salicylate Lvl <7.0 (*)    All other components within normal limits  RESPIRATORY PANEL BY RT PCR (FLU A&B, COVID)  LIPASE, BLOOD  CK  MAGNESIUM   ____________________________________________  EKG  ED ECG REPORT I, Wendall Isabell J, the attending physician, personally viewed and interpreted this ECG.   Date: 02/15/2020  EKG Time: 0403  Rate: 81  Rhythm: normal EKG, normal sinus rhythm  Axis: Normal  Intervals:none  ST&T Change: Nonspecific  ____________________________________________  RADIOLOGY  ED MD interpretation:  No acute cardiopulmonary process; tiny 4 mm hyperdense lesion in the left posterior parietal lobe - adenoma or tiny intraparenchymal contusion.  Official radiology report(s): CT Head Wo Contrast  Result Date: 02/15/2020 CLINICAL DATA:  Change in mental status EXAM: CT HEAD WITHOUT CONTRAST TECHNIQUE: Contiguous axial images were obtained from the base of the skull through the vertex without intravenous contrast. COMPARISON:  None. FINDINGS: Somewhat limited due to patient motion. There appears to be a tiny 4 mm hyperdense focus seen within the posterior left parietal lobe, series 4, image 21. No mass effect is seen. Normal gray-white differentiation. Ventricles are normal in size and contour. Vascular: No hyperdense vessel or unexpected calcification. Skull: The skull is intact. No fracture or focal lesion identified. Sinuses/Orbits: The visualized  paranasal sinuses and mastoid air cells are clear. The orbits and globes intact. Other: None IMPRESSION: Tiny 4 mm hyperdense focus in the posterior left parietal lobe which could represent a small cavernoma, or tiny intraparenchymal contusion. If further evaluation is required would recommend MRI when patient is stable. Electronically Signed   By: Jonna Clark M.D.   On: 02/15/2020 06:24   DG Chest Port 1 View  Result Date: 02/15/2020 CLINICAL DATA:  Spasms and bizarre behavior EXAM: PORTABLE CHEST 1 VIEW COMPARISON:  01/30/2008 FINDINGS: The heart size and mediastinal contours are within normal limits. Both lungs are clear. The visualized skeletal structures are unremarkable. IMPRESSION: No active disease. Electronically Signed   By: Marnee Spring M.D.   On: 02/15/2020 05:02    ____________________________________________   PROCEDURES  Procedure(s) performed (including Critical Care):  .1-3 Lead EKG Interpretation Performed by: Irean Hong, MD Authorized by: Irean Hong, MD     Interpretation: normal     ECG rate:  80   ECG rate assessment: normal     Rhythm: sinus rhythm     Ectopy: none     Conduction: normal     CRITICAL CARE  Performed by: Paulette Blanch   Total critical care time: 45 minutes  Critical care time was exclusive of separately billable procedures and treating other patients.  Critical care was necessary to treat or prevent imminent or life-threatening deterioration.  Critical care was time spent personally by me on the following activities: development of treatment plan with patient and/or surrogate as well as nursing, discussions with consultants, evaluation of patient's response to treatment, examination of patient, obtaining history from patient or surrogate, ordering and performing treatments and interventions, ordering and review of laboratory studies, ordering and review of radiographic studies, pulse oximetry and re-evaluation of patient's  condition.   ____________________________________________   INITIAL IMPRESSION / ASSESSMENT AND PLAN / ED COURSE  As part of my medical decision making, I reviewed the following data within the [place] notes reviewed and incorporated, Labs reviewed, EKG interpreted, Old chart reviewed, Radiograph reviewed and Notes from prior ED visits     [name] was evaluated in Emergency Department on 02/15/2020 for the symptoms described in the history of present illness. He was evaluated in the context of the global COVID-19 pandemic, which necessitated consideration that the patient might be at risk for infection with the SARS-CoV-2 virus that causes COVID-19. Institutional protocols and algorithms that pertain to the evaluation of patients at risk for COVID-19 are in a state of rapid change based on information released by regulatory bodies including the CDC and federal and state organizations. These policies and algorithms were followed during the patient's care in the ED.    19 year old male who presents to the ED with whole body cramps.  Bizarre behavior.  Alternating screaming and calm.  Differential diagnosis includes but is not limited to toxicological, infectious, metabolic, psychiatric etiologies, etc.  I personally reviewed patient's chart and do not see a psychiatric history.  Will obtain toxicological lab work and urinalysis, CT head, chest x-ray.  Will give droperidol for spasms and calming.   Clinical Course as of Feb 14 653  Thu Feb 15, 2020  3716 Patient has calmed down and is resting after IV droperidol.   [JS]  0501 Patient awakening, starting to have myoclonic jerks again.  Screams and hollers with the jerks, then apologizes.  Will try Benadryl in the event this is some sort of dystonic reaction.   [JS]  [id number] No significant relief of symptoms with Benadryl.  Will redose IV droperidol with hopes to keep patient still enough for CT head.  Unable to  obtain collateral information as there has been no family to bedside.   [JS]  [id number] Patient denies taking prescription medications.  He admits that he takes "Perc 30s" off the street.  States he has not had any in a few days.  Patient may be possibly having withdrawal symptoms.  Will try IV Ativan.   [JS]  [id number] Spoke with neurosurgeon on-call Dr. Cari Caraway regarding patient's clinical presentation and CT head results.  Will be fine to admit patient to our facility.  Recommends neurology consult as well.  Discussed all this with hospitalist services who will admit patient.   [JS]    Clinical Course User Index [JS] Paulette Blanch, MD     ____________________________________________   FINAL CLINICAL IMPRESSION(S) / ED DIAGNOSES  Final diagnoses:  Alcoholic intoxication without complication ([place])  Dystonia  Marijuana use  Brain lesion     ED Discharge Orders         Ordered    ibuprofen (ADVIL) 600 MG tablet  Every 8 hours PRN,   Status:  Discontinued     02/15/20 0531    oxyCODONE-acetaminophen (PERCOCET/ROXICET) 5-325 MG tablet  Every 4 hours PRN,   Status:  Discontinued     02/15/20 0531           Note:  This document was prepared using Dragon voice recognition software and may include unintentional dictation errors.   Irean Hong, MD 02/15/20 [phone]

## 2020-04-15 ENCOUNTER — Emergency Department
Admission: EM | Admit: 2020-04-15 | Discharge: 2020-04-15 | Disposition: A | Discharge status: Home / Self Care | Payer: Self-pay | Attending: Emergency Medicine | Admitting: Emergency Medicine

## 2020-04-15 ENCOUNTER — Emergency Department: Payer: Self-pay

## 2020-04-15 ENCOUNTER — Other Ambulatory Visit: Payer: Self-pay

## 2020-04-15 DIAGNOSIS — Z8616 Personal history of COVID-19: Secondary | ICD-10-CM | POA: Insufficient documentation

## 2020-04-15 DIAGNOSIS — Y929 Unspecified place or not applicable: Secondary | ICD-10-CM | POA: Insufficient documentation

## 2020-04-15 DIAGNOSIS — S71132A Puncture wound without foreign body, left thigh, initial encounter: Secondary | ICD-10-CM | POA: Insufficient documentation

## 2020-04-15 DIAGNOSIS — Y999 Unspecified external cause status: Secondary | ICD-10-CM | POA: Insufficient documentation

## 2020-04-15 DIAGNOSIS — W3400XA Accidental discharge from unspecified firearms or gun, initial encounter: Secondary | ICD-10-CM

## 2020-04-15 DIAGNOSIS — Y939 Activity, unspecified: Secondary | ICD-10-CM | POA: Insufficient documentation

## 2020-04-15 LAB — BASIC METABOLIC PANEL
Anion gap: 5 (ref 5–15)
BUN: 14 mg/dL (ref 6–20)
CO2: 29 mmol/L (ref 22–32)
Calcium: 9.3 mg/dL (ref 8.9–10.3)
Chloride: 104 mmol/L (ref 98–111)
Creatinine, Ser: 0.95 mg/dL (ref 0.61–1.24)
GFR calc Af Amer: >60 mL/min (ref >60)
GFR calc non Af Amer: >60 mL/min (ref >60)
Glucose, Bld: 94 mg/dL (ref 70–99)
Potassium: 3.9 mmol/L (ref 3.5–5.1)
Sodium: 138 mmol/L (ref 135–145)

## 2020-04-15 LAB — CBC
HCT: 39.4 % (ref 39.0–52.0)
Hemoglobin: 13 g/dL (ref 13.0–17.0)
MCH: 30.9 pg (ref 26.0–34.0)
MCHC: 33 g/dL (ref 30.0–36.0)
MCV: 93.6 fL (ref 80.0–100.0)
Platelets: 192 10*3/uL (ref 150–400)
RBC: 4.21 MIL/uL — ABNORMAL LOW (ref 4.22–5.81)
RDW: 12.1 % (ref 11.5–15.5)
WBC: 6.5 10*3/uL (ref 4.0–10.5)
nRBC: 0 % (ref 0.0–0.2)

## 2020-04-15 MED ORDER — MORPHINE SULFATE (PF) 4 MG/ML IV SOLN
4.0000 mg | Freq: Once | INTRAVENOUS | Status: AC
  Administered 2020-04-15: 4 mg via INTRAVENOUS
  Filled 2020-04-15: qty 1

## 2020-04-15 MED ORDER — IOHEXOL 350 MG/ML SOLN
125.0000 mL | Freq: Once | INTRAVENOUS | Status: AC | PRN
  Administered 2020-04-15: 150 mL via INTRAVENOUS
  Filled 2020-04-15: qty 125

## 2020-04-15 MED ORDER — ONDANSETRON HCL 4 MG/2ML IJ SOLN
4.0000 mg | Freq: Once | INTRAMUSCULAR | Status: AC
  Administered 2020-04-15: 4 mg via INTRAVENOUS
  Filled 2020-04-15: qty 2

## 2020-04-15 MED ORDER — BACITRACIN-NEOMYCIN-POLYMYXIN 400-5-5000 EX OINT
TOPICAL_OINTMENT | CUTANEOUS | Status: AC
  Filled 2020-04-15: qty 1

## 2020-04-15 MED ORDER — HYDROCODONE-ACETAMINOPHEN 5-325 MG PO TABS: 1.0000 | ORAL_TABLET | ORAL | 0 refills | Status: AC | PRN

## 2020-04-15 MED ORDER — BACITRACIN-NEOMYCIN-POLYMYXIN 400-5-5000 EX OINT: TOPICAL_OINTMENT | Freq: Once | CUTANEOUS | Status: AC
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
## 2021-05-23 IMAGING — CT CT HEAD W/O CM
3 of 6 series · 16 of 47 positions shown, 19 images · non-contrast
Comparison: None.

CLINICAL DATA: Change in mental status

EXAM:
CT HEAD WITHOUT CONTRAST
TECHNIQUE: Contiguous axial images were obtained from the base of the skull
through the vertex without intravenous contrast.

[Series 2: head wo · axial · 0.42mm/px · z∈[-133,-13]mm · 11 of 28 slices shown, 14 images]
[im 2/28  brain]
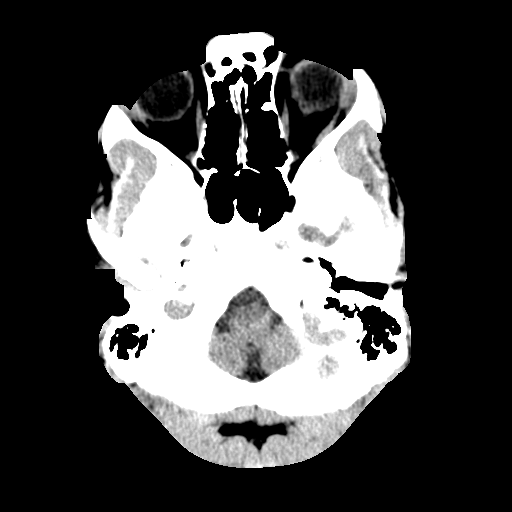
[im 2/28  bone]
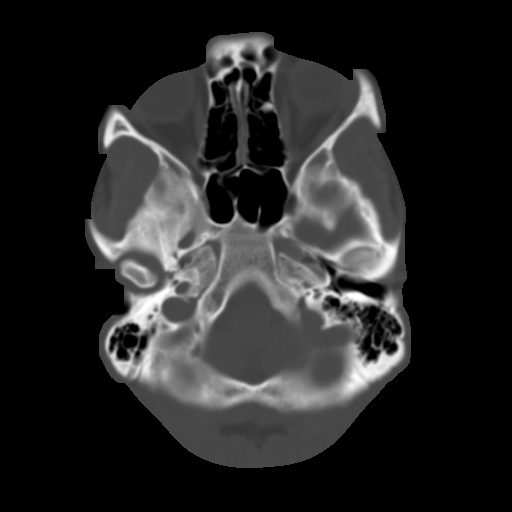
[im 6/28  brain]
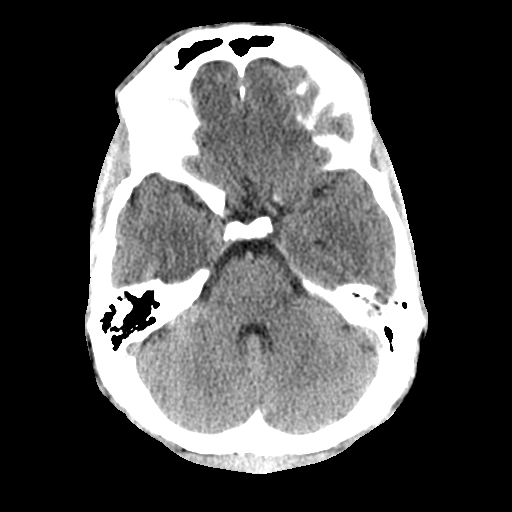
[im 7/28  brain]
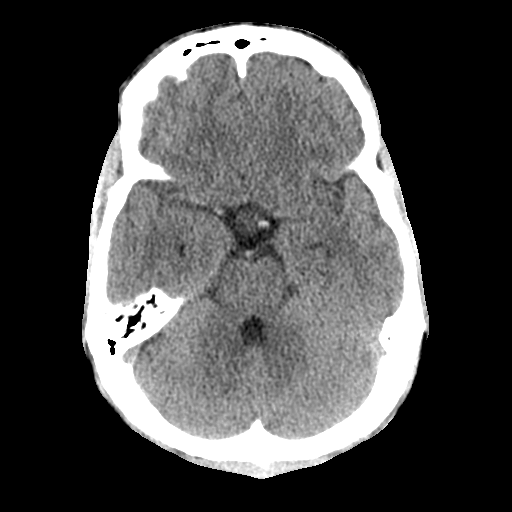
[im 9/28  brain]
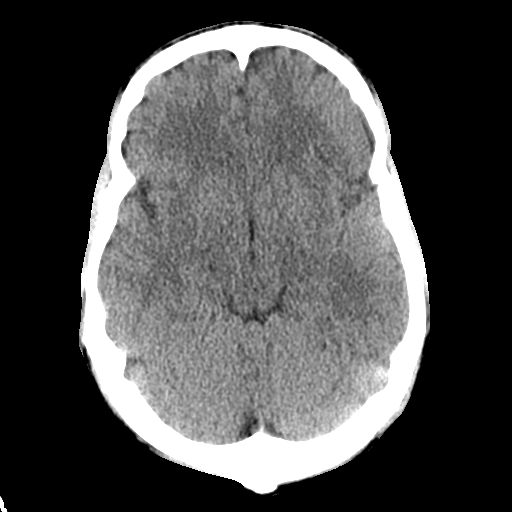
[im 12/28  brain]
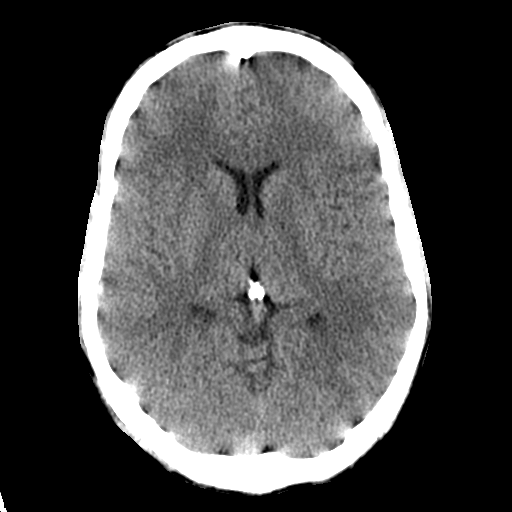
[im 12/28  bone]
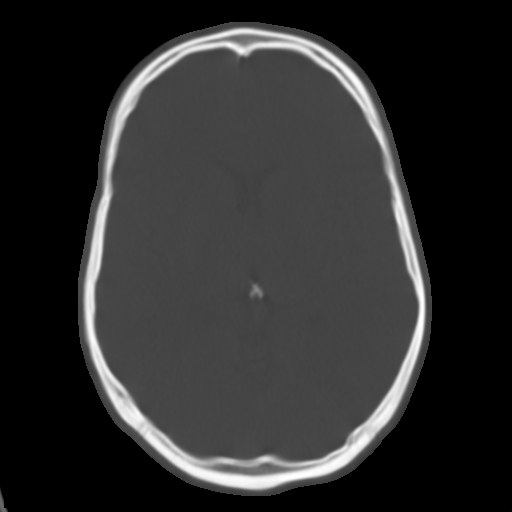
[im 14/28  brain]
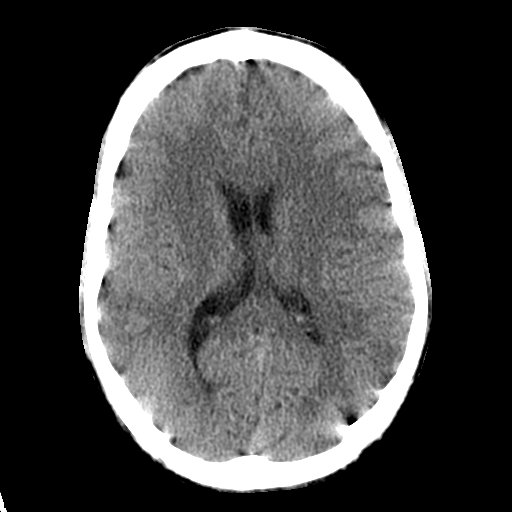
[im 16/28  brain]
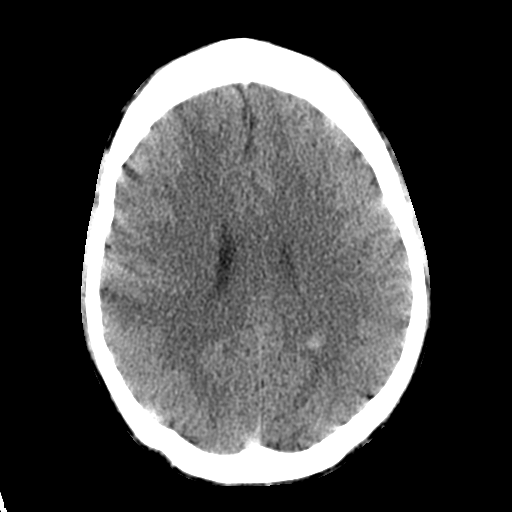
[im 19/28  brain]
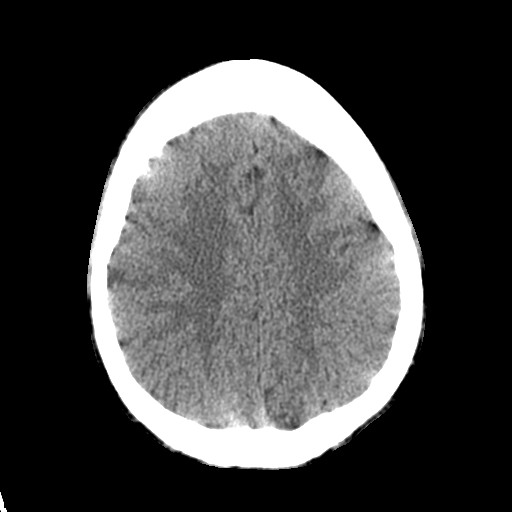
[im 21/28  brain]
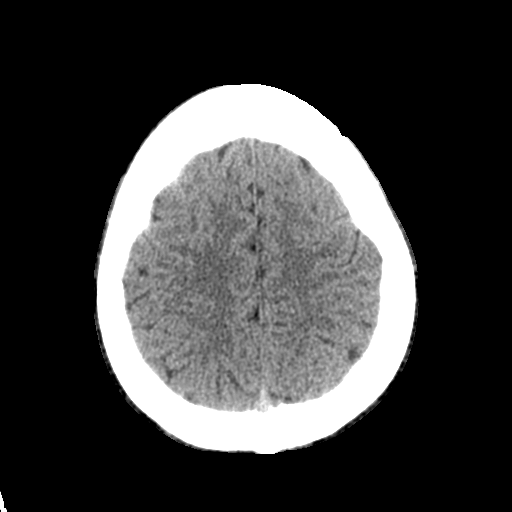
[im 21/28  bone]
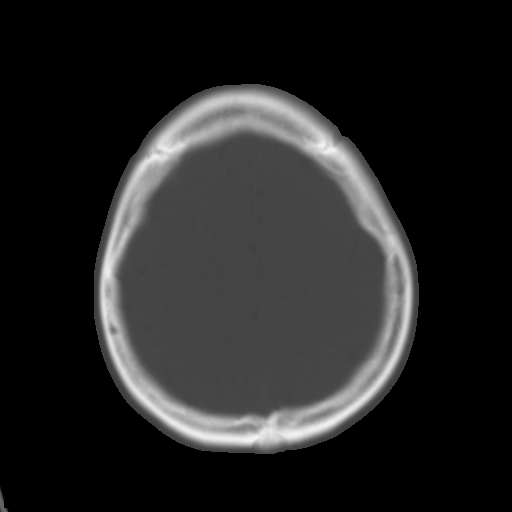
[im 22/28  brain]
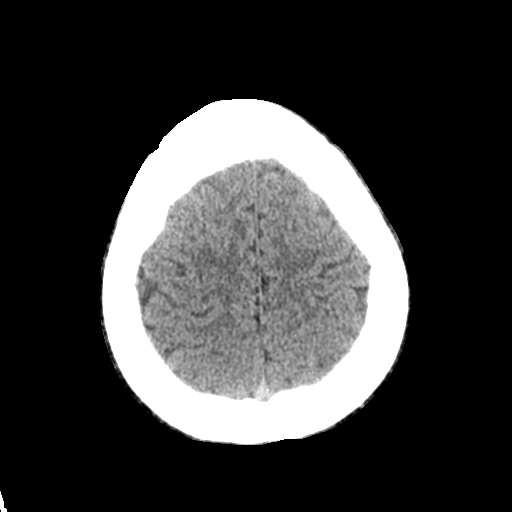
[im 26/28  brain]
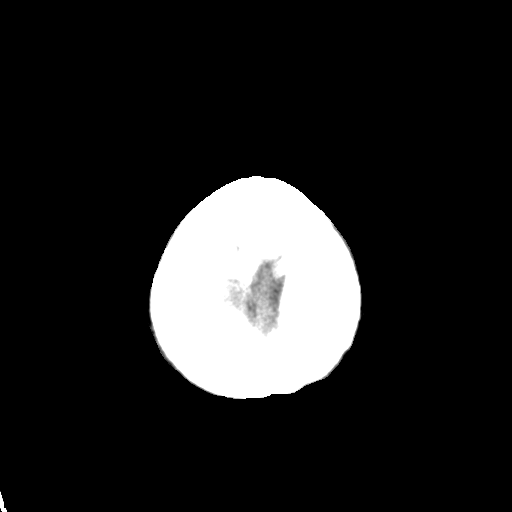

[Series 6: coronal soft tissue · coronal · 0.30mm/px · 3 of 63 slices shown]
[im 16/63  brain]
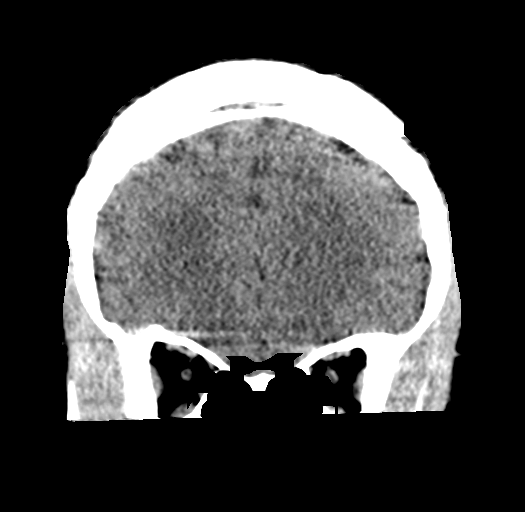
[im 32/63  brain]
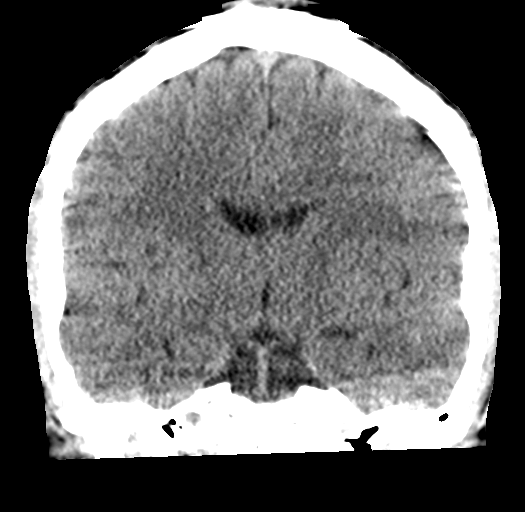
[im 47/63  brain]
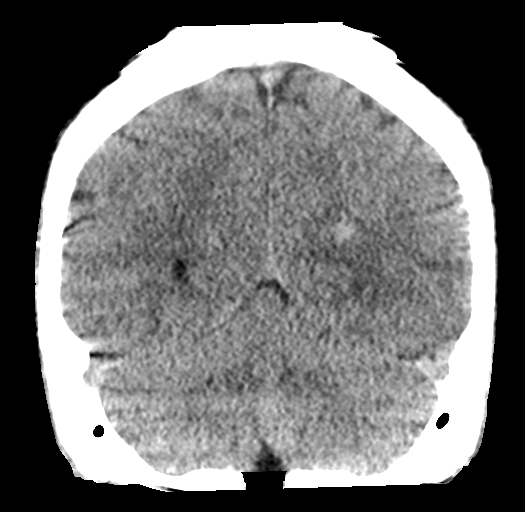

[Series 7: sagittal soft tissue · sagittal · 0.32mm/px · 2 of 49 slices shown]
[im 17/49  brain]
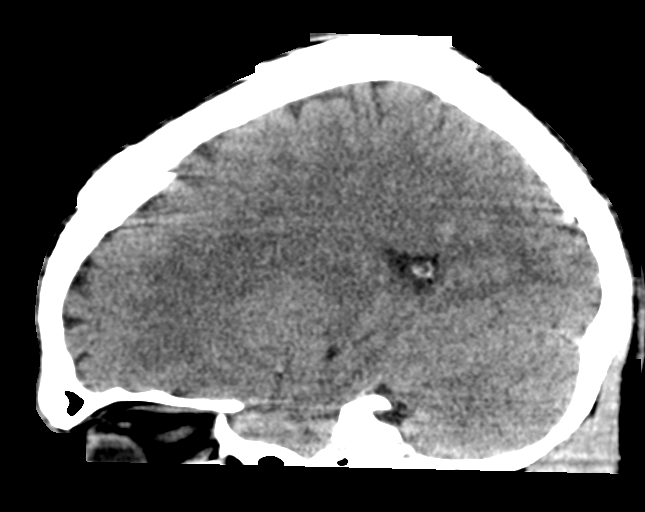
[im 33/49  brain]
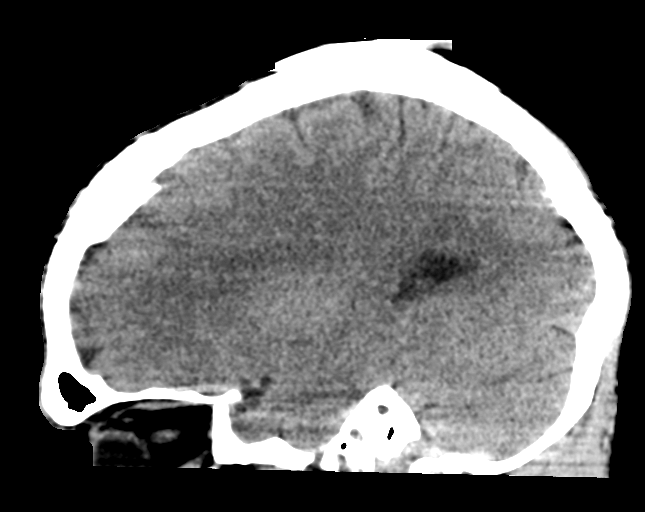

[16 of 47 positions shown; findings below may reference images not displayed]

FINDINGS: Somewhat limited due to patient motion. There appears to be a tiny 4
mm hyperdense focus seen within the posterior left parietal lobe,
series 4, image 21. No mass effect is seen. Normal gray-white
differentiation. Ventricles are normal in size and contour.

Vascular: No hyperdense vessel or unexpected calcification.

Skull: The skull is intact. No fracture or focal lesion identified.

Sinuses/Orbits: The visualized paranasal sinuses and mastoid air
cells are clear. The orbits and globes intact.

Other: None
IMPRESSION: Tiny 4 mm hyperdense focus in the posterior left parietal lobe which
could represent a small cavernoma, or tiny intraparenchymal
contusion. If further evaluation is required would recommend MRI
when patient is stable.
